# Patient Record
Sex: Male | Born: 1995 | Race: Black or African American | Hispanic: No | Marital: Single | Smoking: Never smoker
Health system: Southern US, Community
[De-identification: ages and names within clinical notes are randomized; demographics above are authoritative.]

## PROBLEM LIST (undated history)

## (undated) DIAGNOSIS — J302 Other seasonal allergic rhinitis: Secondary | ICD-10-CM

## (undated) DIAGNOSIS — D573 Sickle-cell trait: Secondary | ICD-10-CM

## (undated) HISTORY — DX: Other seasonal allergic rhinitis: J30.2

## (undated) HISTORY — DX: Sickle-cell trait: D57.3

---
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
## 2013-01-06 ENCOUNTER — Ambulatory Visit: Payer: 59 | Admitting: Pediatrics

## 2013-02-10 ENCOUNTER — Ambulatory Visit (INDEPENDENT_AMBULATORY_CARE_PROVIDER_SITE_OTHER): Payer: 59 | Admitting: Family Medicine

## 2013-02-10 ENCOUNTER — Encounter: Payer: Self-pay | Admitting: Family Medicine

## 2013-02-10 VITALS — BP 126/84 | HR 88 | Temp 98.2°F | Ht 74.0 in | Wt 188.0 lb

## 2013-02-10 DIAGNOSIS — Z0000 Encounter for general adult medical examination without abnormal findings: Secondary | ICD-10-CM

## 2013-02-10 DIAGNOSIS — R21 Rash and other nonspecific skin eruption: Secondary | ICD-10-CM

## 2013-02-10 NOTE — Patient Instructions (Signed)
For skin rash - start using selsun blue on body (keep on for 5-10 min prior to washing off).  If this doesn't help, return to see dermatologist. We have requested records from [place] on latest immunizations. Keep home and car smoke-free Stay physically active (>30-60 minutes 3 times a day) Maximum 1-2 hours of TV & computer a day Wear seatbelts, ensure passengers do too Drive responsibly when you get your license Avoid alcohol, smoking, drug use Ride with designated driver or call for a ride if drinking Abstinence from sex is the best way to avoid pregnancy and STDs Limit sun, use sunscreen Seek help if you feel angry, depressed, or sad often 3 meals a day and healthy snacks Brush teeth twice a day Participate in social activities, sports, community groups Maintain strong family relationships Follow up in 1 year  Tinea Versicolor Tinea versicolor is a common yeast infection of the skin. This condition becomes known when the yeast on our skin starts to overgrow (yeast is a normal inhabitant on our skin). This condition is noticed as white or light brown patches on brown skin, and is more evident in the summer on tanned skin. These areas are slightly scaly if scratched. The light patches from the yeast become evident when the yeast creates "holes in your suntan". This is most often noticed in the summer. The patches are usually located on the chest, back, pubis, neck and body folds. However, it may occur on any area of body. Mild itching and inflammation (redness or soreness) may be present. DIAGNOSIS  The diagnosisof this is made clinically (by looking). Cultures from samples are usually not needed. Examination under the microscope may help. However, yeast is normally found on skin. The diagnosis still remains clinical. Examination under Wood's Ultraviolet Light can determine the extent of the infection. TREATMENT  This common infection is usually only of cosmetic (only a concern to your  appearance). It is easily treated with dandruff shampoo used during showers or bathing. Vigorous scrubbing will eliminate the yeast over several days time. The light areas in your skin may remain for weeks or months after the infection is cured unless your skin is exposed to sunlight. The lighter or darker spots caused by the fungus that remain after complete treatment are not a sign of treatment failure; it will take a long time to resolve. Your caregiver may recommend a number of commercial preparations or medication by mouth if home care is not working. Recurrence is common and preventative medication may be necessary. This skin condition is not highly contagious. Special care is not needed to protect close friends and family members. Normal hygiene is usually enough. Follow up is required only if you develop complications (such as a secondary infection from scratching), if recommended by your caregiver, or if no relief is obtained from the preparations used. Document Released: 11/03/2000 Document Revised: 01/29/2012 Document Reviewed: 12/16/2008 [facility] Patient Information 2013 [place], [place].

## 2013-02-10 NOTE — Assessment & Plan Note (Addendum)
Thought tinea versicolor. Discussed treatment - rec selsun blue weekly on body. If not improved return to dermatologist.

## 2013-02-10 NOTE — Assessment & Plan Note (Signed)
Anticipatory guidance provided. Discussed healthy diet and lifestyle, avoiding EtOH, smoking, drugs. Discussed importance of improving grades. No updated immunization registry.  Have sent release form to prior pediatrician in [place].

## 2013-02-10 NOTE — Progress Notes (Signed)
Subjective:    Patient ID: Dale Allen, male    DOB: 02-25-1996, 17 y.o.   MRN: [id number]  HPI CC: Well child check  Home - gets along with family.   Edu - [employer] - senior.  Cs and Ds.  Just finished basketball season.  Gets along with ppl at school. Activity - Basketball Diet - low in fruits and vegetables.  Fast food daily.  Good fluids.   Depression - denies sadness, anhedonia, depression. Sex/drugs - denies smoking, EtOH, rec drugs.   Sexually active - 2 partners in last year, condoms 100%.  GF to be placed on BC.  Paternal grandmother deceased 62s of MI Grandparents deceased 39s from lung cancer One grandparent killed age 59s (murder)  Wants to join [employer] when he finishes.  Skin rash - on chest, back abdomen.  Has seen multiple dermatologists.  Unclear dx.  thought tinea versicolor but several mo h/o oral antifungal without effect.  Planning on returning to derm.  Medications and allergies reviewed and updated in chart.  Past histories reviewed and updated if relevant as below. Patient Active Problem List  Diagnosis  . Sickle cell trait  . Seasonal allergies  . Healthcare maintenance   Past Medical History  Diagnosis Date  . Seasonal allergies   . Sickle cell trait    History reviewed. No pertinent past surgical history. History  Substance Use Topics  . Smoking status: Never Smoker   . Smokeless tobacco: Never Used  . Alcohol Use: No   Family History  Problem Relation Age of Onset  . Sickle cell trait Father   . Cancer Maternal Grandmother 42    lung cancer  . Cancer Paternal Grandmother 80    lung cancer  . Coronary artery disease Paternal Grandfather 40    MI   No Known Allergies Current Outpatient Prescriptions on File Prior to Visit  Medication Sig Dispense Refill  . loratadine (CLARITIN) 10 MG tablet Take 10 mg by mouth daily as needed.         No current facility-administered medications on file prior to visit.    Review of Systems   Constitutional: Negative for fever, chills, activity change, appetite change, fatigue and unexpected weight change.  HENT: Negative for hearing loss and neck pain.   Eyes: Negative for visual disturbance.  Respiratory: Negative for cough, chest tightness, shortness of breath and wheezing.   Cardiovascular: Negative for chest pain, palpitations and leg swelling.  Gastrointestinal: Negative for nausea, vomiting, abdominal pain, diarrhea, constipation, blood in stool and abdominal distention.  Genitourinary: Negative for hematuria and difficulty urinating.  Musculoskeletal: Negative for myalgias and arthralgias.  Skin: Negative for rash.  Neurological: Positive for headaches (mild occasional). Negative for dizziness, seizures and syncope.  Hematological: Negative for adenopathy. Does not bruise/bleed easily.  Psychiatric/Behavioral: Negative for dysphoric mood. The patient is not nervous/anxious.        Objective:   Physical Exam  Nursing note and vitals reviewed. Constitutional: He is oriented to person, place, and time. He appears well-developed and well-nourished. No distress.  HENT:  Head: Normocephalic and atraumatic.  Right Ear: Hearing, tympanic membrane, external ear and ear canal normal.  Left Ear: Hearing, tympanic membrane, external ear and ear canal normal.  Nose: Nose normal.  Mouth/Throat: Oropharynx is clear and moist. No oropharyngeal exudate.  L TM with cerumen  Eyes: Conjunctivae and EOM are normal. Pupils are equal, round, and reactive to light. No scleral icterus.  Neck: Normal range of motion. Neck supple.  No thyromegaly present.  Cardiovascular: Normal rate, regular rhythm, normal heart sounds and intact distal pulses.   No murmur heard. Pulses:      Radial pulses are 2+ on the right side, and 2+ on the left side.  Pulmonary/Chest: Effort normal and breath sounds normal. No respiratory distress. He has no wheezes. He has no rales.  Abdominal: Soft. Bowel sounds  are normal. He exhibits no distension and no mass. There is no tenderness. There is no rebound and no guarding.  Musculoskeletal: Normal range of motion. He exhibits no edema.       Right shoulder: Normal.       Left shoulder: Normal.       Right elbow: Normal.      Left elbow: Normal.       Right hip: Normal.       Left hip: Normal.       Right knee: Normal.       Left knee: Normal.  Lymphadenopathy:    He has no cervical adenopathy.  Neurological: He is alert and oriented to person, place, and time.  CN grossly intact, station and gait intact  Skin: Skin is warm and dry. Rash noted.  Hyperpigmented splotchy rash on neck, chest, abdomen and back  Psychiatric: He has a normal mood and affect. His behavior is normal. Judgment and thought content normal.       Assessment & Plan:

## 2015-08-29 ENCOUNTER — Encounter (HOSPITAL_COMMUNITY): Payer: Self-pay | Admitting: *Deleted

## 2015-08-29 ENCOUNTER — Emergency Department (HOSPITAL_COMMUNITY): Payer: BLUE CROSS/BLUE SHIELD

## 2015-08-29 ENCOUNTER — Emergency Department (HOSPITAL_COMMUNITY)
Admission: EM | Admit: 2015-08-29 | Discharge: 2015-08-29 | Disposition: A | Discharge status: Home / Self Care | Payer: BLUE CROSS/BLUE SHIELD | Attending: Emergency Medicine | Admitting: Emergency Medicine

## 2015-08-29 DIAGNOSIS — S70211A Abrasion, right hip, initial encounter: Secondary | ICD-10-CM | POA: Insufficient documentation

## 2015-08-29 DIAGNOSIS — S20412A Abrasion of left back wall of thorax, initial encounter: Secondary | ICD-10-CM | POA: Insufficient documentation

## 2015-08-29 DIAGNOSIS — S20411A Abrasion of right back wall of thorax, initial encounter: Secondary | ICD-10-CM | POA: Diagnosis not present

## 2015-08-29 DIAGNOSIS — T148XXA Other injury of unspecified body region, initial encounter: Secondary | ICD-10-CM

## 2015-08-29 DIAGNOSIS — T1490XA Injury, unspecified, initial encounter: Secondary | ICD-10-CM

## 2015-08-29 DIAGNOSIS — S29092A Other injury of muscle and tendon of back wall of thorax, initial encounter: Secondary | ICD-10-CM | POA: Diagnosis present

## 2015-08-29 DIAGNOSIS — Y92481 Parking lot as the place of occurrence of the external cause: Secondary | ICD-10-CM | POA: Insufficient documentation

## 2015-08-29 DIAGNOSIS — Y9389 Activity, other specified: Secondary | ICD-10-CM | POA: Insufficient documentation

## 2015-08-29 DIAGNOSIS — Z23 Encounter for immunization: Secondary | ICD-10-CM | POA: Diagnosis not present

## 2015-08-29 DIAGNOSIS — Y998 Other external cause status: Secondary | ICD-10-CM | POA: Diagnosis not present

## 2015-08-29 MED ORDER — TETANUS-DIPHTH-ACELL PERTUSSIS 5-2.5-18.5 LF-MCG/0.5 IM SUSP
0.5000 mL | Freq: Once | INTRAMUSCULAR | Status: AC
  Administered 2015-08-29: 0.5 mL via INTRAMUSCULAR
  Filled 2015-08-29: qty 0.5
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
## 2015-08-30 ENCOUNTER — Encounter: Payer: Self-pay | Admitting: Family Medicine
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
## 2019-09-23 ENCOUNTER — Other Ambulatory Visit: Payer: Self-pay | Admitting: *Deleted

## 2019-09-23 DIAGNOSIS — K624 Stenosis of anus and rectum: Secondary | ICD-10-CM

## 2019-09-24 LAB — NOVEL CORONAVIRUS, NAA: SARS-CoV-2, NAA: NOT DETECTED
